# Patient Record
Sex: Male | Born: 1960 | Hispanic: No | Marital: Married | Smoking: Never smoker
Health system: Southern US, Community
[De-identification: ages and names within clinical notes are randomized; demographics above are authoritative.]

## PROBLEM LIST (undated history)

## (undated) DIAGNOSIS — I10 Essential (primary) hypertension: Secondary | ICD-10-CM

## (undated) DIAGNOSIS — E119 Type 2 diabetes mellitus without complications: Secondary | ICD-10-CM

---

## 2000-04-15 ENCOUNTER — Encounter: Admission: RE | Admit: 2000-04-15 | Discharge: 2000-04-15 | Payer: Self-pay | Admitting: Cardiovascular Disease

## 2000-04-15 ENCOUNTER — Encounter: Payer: Self-pay | Admitting: Cardiovascular Disease

## 2004-03-18 ENCOUNTER — Encounter: Admission: RE | Admit: 2004-03-18 | Discharge: 2004-03-18 | Payer: Self-pay | Admitting: Cardiovascular Disease

## 2004-06-01 ENCOUNTER — Emergency Department (HOSPITAL_COMMUNITY): Admission: EM | Admit: 2004-06-01 | Discharge: 2004-06-02 | Payer: Self-pay | Admitting: Emergency Medicine

## 2007-03-18 ENCOUNTER — Encounter: Admission: RE | Admit: 2007-03-18 | Discharge: 2007-03-18 | Payer: Self-pay | Admitting: Cardiovascular Disease

## 2011-11-17 ENCOUNTER — Telehealth: Payer: Self-pay | Admitting: *Deleted

## 2011-11-17 NOTE — Telephone Encounter (Signed)
Amy with [facility] called requesting ICD-9 code for labs.  Having Factor V Leiden mutation test for his son.  ICD-9 code for this is 289.81.  Code given to Amy.

## 2011-12-30 ENCOUNTER — Encounter: Payer: Self-pay | Admitting: Oncology

## 2015-02-23 ENCOUNTER — Emergency Department (HOSPITAL_COMMUNITY)
Admission: EM | Admit: 2015-02-23 | Discharge: 2015-02-23 | Disposition: A | Discharge status: Home / Self Care | Payer: Self-pay | Attending: Emergency Medicine | Admitting: Emergency Medicine

## 2015-02-23 ENCOUNTER — Encounter (HOSPITAL_COMMUNITY): Payer: Self-pay

## 2015-02-23 ENCOUNTER — Emergency Department (HOSPITAL_COMMUNITY): Payer: Self-pay

## 2015-02-23 DIAGNOSIS — M546 Pain in thoracic spine: Secondary | ICD-10-CM | POA: Insufficient documentation

## 2015-02-23 LAB — URINALYSIS, ROUTINE W REFLEX MICROSCOPIC
BILIRUBIN URINE: NEGATIVE
Glucose, UA: NEGATIVE mg/dL
Hgb urine dipstick: NEGATIVE
KETONES UR: NEGATIVE mg/dL
Leukocytes, UA: NEGATIVE
NITRITE: NEGATIVE
PROTEIN: NEGATIVE mg/dL
Specific Gravity, Urine: 1.022 (ref 1.005–1.030)
pH: 6.5 (ref 5.0–8.0)

## 2015-02-23 MED ORDER — CYCLOBENZAPRINE HCL 10 MG PO TABS: 10.0000 mg | ORAL_TABLET | Freq: Two times a day (BID) | ORAL | Status: DC | PRN

## 2015-02-23 MED ORDER — AZITHROMYCIN 250 MG PO TABS: 250.0000 mg | ORAL_TABLET | Freq: Every day | ORAL | Status: DC

## 2015-02-23 MED ORDER — IBUPROFEN 800 MG PO TABS
800.0000 mg | ORAL_TABLET | Freq: Three times a day (TID) | ORAL | Status: DC
Start: 2015-02-23 — End: 2015-02-24

## 2015-02-23 MED ORDER — OXYCODONE-ACETAMINOPHEN 5-325 MG PO TABS
1.0000 | ORAL_TABLET | Freq: Once | ORAL | Status: AC
  Administered 2015-02-23: 1 via ORAL
  Filled 2015-02-23: qty 1

## 2015-02-23 NOTE — ED Notes (Signed)
Pt states that the pain in his right flank started at 6pm on Friday while standing. Pt denies N/V. Pt states he has not been able to urinate as frequently as normal.

## 2015-02-23 NOTE — ED Provider Notes (Signed)
CSN: [id number]     Arrival date & time 02/23/15  0035 History   First MD Initiated Contact with Patient 02/23/15 0130     Chief Complaint  Patient presents with  . Flank Pain    right     (Consider location/radiation/quality/duration/timing/severity/associated sxs/prior Treatment) HPI Comments: Patient without significant medical history presents with right flank pain that started yesterday. No known injury. No midline back pain. The pain does not radiate. He denies abdominal pain or groin pain. No hematuria or dysuria. No history of stones and no nausea/vomiting. He states the pain is worse with twisting movement and with deep breathing. He states that the last time he had similar pain he had pneumonia. No cough or fever  Patient is a 54 y.o. male presenting with flank pain. The history is provided by the patient. No language interpreter was used.  Flank Pain Pertinent negatives include no abdominal pain, chest pain, chills, coughing, fever, nausea or vomiting.    History reviewed. No pertinent past medical history. History reviewed. No pertinent past surgical history. History reviewed. No pertinent family history. Social History  Substance Use Topics  . Smoking status: Never Smoker   . Smokeless tobacco: None  . Alcohol Use: No    Review of Systems  Constitutional: Negative for fever and chills.  Respiratory: Negative.  Negative for cough and shortness of breath.        Pain with respiration.  Cardiovascular: Negative.  Negative for chest pain.  Gastrointestinal: Negative.  Negative for nausea, vomiting and abdominal pain.  Genitourinary: Positive for flank pain. Negative for dysuria and hematuria.  Musculoskeletal: Negative.  Negative for back pain.  Neurological: Negative.       Allergies  Review of patient's allergies indicates no known allergies.  Home Medications   Prior to Admission medications   Not on File   BP 119/75 mmHg  Pulse 75  Temp(Src) 98.7 F  (37.1 C) (Oral)  Resp 18  SpO2 95% Physical Exam  Constitutional: He is oriented to person, place, and time. He appears well-developed and well-nourished.  HENT:  Head: Normocephalic.  Neck: Normal range of motion. Neck supple.  Cardiovascular: Normal rate and regular rhythm.   Pulmonary/Chest: Effort normal and breath sounds normal. He has no wheezes. He has no rales.  Splinting with deep breath.  Abdominal: Soft. Bowel sounds are normal. There is no tenderness. There is no rebound and no guarding.  Musculoskeletal: Normal range of motion.  Neurological: He is alert and oriented to person, place, and time.  Skin: Skin is warm and dry. No rash noted.  Psychiatric: He has a normal mood and affect.    ED Course  Procedures (including critical care time) Labs Review Labs Reviewed  URINALYSIS, ROUTINE W REFLEX MICROSCOPIC (NOT AT [facility])   Results for orders placed or performed during the hospital encounter of 02/23/15  Urinalysis, Routine w reflex microscopic-may I&O cath if menses (not at [facility])  Result Value Ref Range   Color, Urine YELLOW YELLOW   APPearance CLEAR CLEAR   Specific Gravity, Urine 1.022 1.005 - 1.030   pH 6.5 5.0 - 8.0   Glucose, UA NEGATIVE NEGATIVE mg/dL   Hgb urine dipstick NEGATIVE NEGATIVE   Bilirubin Urine NEGATIVE NEGATIVE   Ketones, ur NEGATIVE NEGATIVE mg/dL   Protein, ur NEGATIVE NEGATIVE mg/dL   Nitrite NEGATIVE NEGATIVE   Leukocytes, UA NEGATIVE NEGATIVE   Dg Chest 2 View  02/23/2015  CLINICAL DATA:  Right-sided flank and chest pain, onset last evening.  EXAM: CHEST  2 VIEW COMPARISON:  03/18/2007 FINDINGS: Low lung volumes. The cardiomediastinal contours are normal. Linear opacities in the left lower lobe with retrocardiac scarring. Pulmonary vasculature is normal. No consolidation, pleural effusion, or pneumothorax. No acute osseous abnormalities are seen. IMPRESSION: Linear left lower lobe opacities, likely atelectasis. Electronically Signed   By:  Rubye Oaks M.D.   On: 02/23/2015 02:13    Imaging Review No results found. I have personally reviewed and evaluated these images and lab results as part of my medical decision-making.   EKG Interpretation None      MDM   Final diagnoses:  None    1. Back pain  The patient's pain does not radiate, no urinary symptoms, negative UA, no history of kidney stones. Pain is worse with movement. He reports same pain with previous PNA. Negative CXR for PNA. Doubt kidney stones. Feel the patient's pain likely musculoskeletal requiring supportive care. As the patient feels he is getting ill and reports similar symptoms of flank pain with PNA but has no cough or fever at this point, will provide Rx for Z-Pack to fill if he develops a cough/fever. Discussed with the patient who understands and agrees.     Elpidio Anis, PA-C 02/23/15 0242  Layla Maw Ward, DO 02/23/15 [phone]

## 2015-02-23 NOTE — Discharge Instructions (Signed)
Back Pain, Adult °Back pain is very common in adults. The cause of back pain is rarely dangerous and the pain often gets better over time. The cause of your back pain may not be known. Some common causes of back pain include: °· Strain of the muscles or ligaments supporting the spine. °· Wear and tear (degeneration) of the spinal disks. °· Arthritis. °· Direct injury to the back. °For many people, back pain may return. Since back pain is rarely dangerous, most people can learn to manage this condition on their own. °HOME CARE INSTRUCTIONS °Watch your back pain for any changes. The following actions may help to lessen any discomfort you are feeling: °· Remain active. It is stressful on your back to sit or stand in one place for long periods of time. Do not sit, drive, or stand in one place for more than 30 minutes at a time. Take short walks on even surfaces as soon as you are able. Try to increase the length of time you walk each day. °· Exercise regularly as directed by your health care provider. Exercise helps your back heal faster. It also helps avoid future injury by keeping your muscles strong and flexible. °· Do not stay in bed. Resting more than 1-2 days can delay your recovery. °· Pay attention to your body when you bend and lift. The most comfortable positions are those that put less stress on your recovering back. Always use proper lifting techniques, including: °· Bending your knees. °· Keeping the load close to your body. °· Avoiding twisting. °· Find a comfortable position to sleep. Use a firm mattress and lie on your side with your knees slightly bent. If you lie on your back, put a pillow under your knees. °· Avoid feeling anxious or stressed. Stress increases muscle tension and can worsen back pain. It is important to recognize when you are anxious or stressed and learn ways to manage it, such as with exercise. °· Take medicines only as directed by your health care provider. Over-the-counter  medicines to reduce pain and inflammation are often the most helpful. Your health care provider may prescribe muscle relaxant drugs. These medicines help dull your pain so you can more quickly return to your normal activities and healthy exercise. °· Apply ice to the injured area: °· Put ice in a plastic bag. °· Place a towel between your skin and the bag. °· Leave the ice on for 20 minutes, 2-3 times a day for the first 2-3 days. After that, ice and heat may be alternated to reduce pain and spasms. °· Maintain a healthy weight. Excess weight puts extra stress on your back and makes it difficult to maintain good posture. °SEEK MEDICAL CARE IF: °· You have pain that is not relieved with rest or medicine. °· You have increasing pain going down into the legs or buttocks. °· You have pain that does not improve in one week. °· You have night pain. °· You lose weight. °· You have a fever or chills. °SEEK IMMEDIATE MEDICAL CARE IF:  °· You develop new bowel or bladder control problems. °· You have unusual weakness or numbness in your arms or legs. °· You develop nausea or vomiting. °· You develop abdominal pain. °· You feel faint. °  °This information is not intended to replace advice given to you by your health care provider. Make sure you discuss any questions you have with your health care provider. °  °Document Released: 03/02/2005 Document Revised: 03/23/2014 Document Reviewed: 07/04/2013 °Elsevier Interactive Patient Education ©2016 [employer] °Heat Therapy °Heat therapy can help ease sore, stiff, injured, and tight muscles and joints. Heat relaxes your muscles, which may help ease your pain.  °RISKS AND COMPLICATIONS °If you have any of the following conditions, do not use heat therapy unless your health care provider has approved: °· Poor circulation. °· Healing wounds or scarred skin in the area being treated. °· Diabetes, heart disease, or high blood pressure. °· Not being able to feel (numbness) the area  being treated. °· Unusual swelling of the area being treated. °· Active infections. °· Blood clots. °· Cancer. °· Inability to communicate pain. This may include young children and people who have problems with their brain function (dementia). °· Pregnancy. °Heat therapy should only be used on old, pre-existing, or long-lasting (chronic) injuries. Do not use heat therapy on new injuries unless directed by your health care provider. °HOW TO USE HEAT THERAPY °There are several different kinds of heat therapy, including: °· Moist heat pack. °· Warm water bath. °· Hot water bottle. °· Electric heating pad. °· Heated gel pack. °· Heated wrap. °· Electric heating pad. °Use the heat therapy method suggested by your health care provider. Follow your health care provider's instructions on when and how to use heat therapy. °GENERAL HEAT THERAPY RECOMMENDATIONS °· Do not sleep while using heat therapy. Only use heat therapy while you are awake. °· Your skin may turn pink while using heat therapy. Do not use heat therapy if your skin turns red. °· Do not use heat therapy if you have new pain. °· High heat or long exposure to heat can cause burns. Be careful when using heat therapy to avoid burning your skin. °· Do not use heat therapy on areas of your skin that are already irritated, such as with a rash or sunburn. °SEEK MEDICAL CARE IF: °· You have blisters, redness, swelling, or numbness. °· You have new pain. °· Your pain is worse. °MAKE SURE YOU: °· Understand these instructions. °· Will watch your condition. °· Will get help right away if you are not doing well or get worse. °  °This information is not intended to replace advice given to you by your health care provider. Make sure you discuss any questions you have with your health care provider. °  °Document Released: 05/25/2011 Document Revised: 03/23/2014 Document Reviewed: 04/25/2013 °Elsevier Interactive Patient Education ©2016 Elsevier Inc. ° °

## 2015-02-24 ENCOUNTER — Emergency Department (HOSPITAL_COMMUNITY)
Admission: EM | Admit: 2015-02-24 | Discharge: 2015-02-24 | Disposition: A | Discharge status: Home / Self Care | Payer: Self-pay | Attending: Emergency Medicine | Admitting: Emergency Medicine

## 2015-02-24 ENCOUNTER — Emergency Department (HOSPITAL_COMMUNITY): Payer: Self-pay

## 2015-02-24 ENCOUNTER — Encounter (HOSPITAL_COMMUNITY): Payer: Self-pay | Admitting: Emergency Medicine

## 2015-02-24 DIAGNOSIS — R109 Unspecified abdominal pain: Secondary | ICD-10-CM | POA: Insufficient documentation

## 2015-02-24 DIAGNOSIS — M545 Low back pain: Secondary | ICD-10-CM | POA: Insufficient documentation

## 2015-02-24 DIAGNOSIS — Z791 Long term (current) use of non-steroidal anti-inflammatories (NSAID): Secondary | ICD-10-CM | POA: Insufficient documentation

## 2015-02-24 MED ORDER — NAPROXEN 250 MG PO TABS: 250.0000 mg | ORAL_TABLET | Freq: Two times a day (BID) | ORAL | Status: DC

## 2015-02-24 MED ORDER — METHOCARBAMOL 500 MG PO TABS: 500.0000 mg | ORAL_TABLET | Freq: Two times a day (BID) | ORAL | Status: DC | PRN

## 2015-02-24 MED ORDER — OXYCODONE-ACETAMINOPHEN 5-325 MG PO TABS
1.0000 | ORAL_TABLET | Freq: Once | ORAL | Status: AC
  Administered 2015-02-24: 1 via ORAL
  Filled 2015-02-24: qty 1

## 2015-02-24 MED ORDER — METHOCARBAMOL 500 MG PO TABS
1000.0000 mg | ORAL_TABLET | Freq: Once | ORAL | Status: AC
  Administered 2015-02-24: 1000 mg via ORAL
  Filled 2015-02-24: qty 2

## 2015-02-24 MED ORDER — OXYCODONE-ACETAMINOPHEN 5-325 MG PO TABS: 1.0000 | ORAL_TABLET | ORAL | Status: DC | PRN

## 2015-02-24 NOTE — ED Provider Notes (Signed)
CSN: [id number]     Arrival date & time 02/24/15  1505 History  By signing my name below, I, Elon Spanner, attest that this documentation has been prepared under the direction and in the presence of [employer], PA-C. Electronically Signed: Elon Spanner ED Scribe. 02/24/2015. 3:44 PM.    Chief Complaint  Patient presents with  . Back Pain   The history is provided by the patient. No language interpreter was used.   HPI Comments: Joseph Velazquez is a 54 y.o. male who presents to the Emergency Department complaining of constant, >10/10, worsening, non-radiating right lower back pain onset two days ago without injury; worse with movement.  He reports he has had similar pain before that was dx'd as PNA, so he was evaluated for the same complaint in the ED two days ago with normal UA and CXR.  He was discharged home with motrin and muscle relaxer, both of which he has taken without relief.  He was also prescribed a Z-pack to be filled if he developed fever and chills.  He has not developed these symptoms but began the antibiotic regardless.  He denies hx of kidney stones.  He denies abdominal pains, hematuria, SOB, fever, cough, lightheadedness, LOC.    History reviewed. No pertinent past medical history. History reviewed. No pertinent past surgical history. No family history on file. Social History  Substance Use Topics  . Smoking status: Never Smoker   . Smokeless tobacco: None  . Alcohol Use: No    Review of Systems A complete 10 system review of systems was obtained and all systems are negative except as noted in the HPI and PMH.   Allergies  Review of patient's allergies indicates no known allergies.  Home Medications   Prior to Admission medications   Medication Sig Start Date End Date Taking? Authorizing Provider  azithromycin (ZITHROMAX) 250 MG tablet Take 1 tablet (250 mg total) by mouth daily. Take first 2 tablets together, then 1 every day until finished. 02/23/15   Elpidio Anis, PA-C  cyclobenzaprine (FLEXERIL) 10 MG tablet Take 1 tablet (10 mg total) by mouth 2 (two) times daily as needed for muscle spasms. 02/23/15   Elpidio Anis, PA-C  ibuprofen (ADVIL,MOTRIN) 800 MG tablet Take 1 tablet (800 mg total) by mouth 3 (three) times daily. 02/23/15   Elpidio Anis, PA-C  naproxen sodium (ANAPROX) 220 MG tablet Take 220 mg by mouth 2 (two) times daily as needed (pain).    Historical Provider, MD   BP 124/85 mmHg  Pulse 90  Temp(Src) 97.8 F (36.6 C) (Oral)  Resp 16  SpO2 96% Physical Exam  Constitutional: He is oriented to person, place, and time. He appears well-developed and well-nourished. No distress.  HENT:  Head: Normocephalic and atraumatic.  Eyes: Conjunctivae and EOM are normal.  Neck: Normal range of motion. Neck supple. No tracheal deviation present.  Cardiovascular: Normal rate, regular rhythm and intact distal pulses.   Pulmonary/Chest: Effort normal and breath sounds normal. No respiratory distress.  Abdominal: Soft. Bowel sounds are normal. He exhibits no distension and no mass. There is no tenderness. There is no rebound and no guarding.  Genitourinary:  No CVA tenderness to percussion bilaterally  Musculoskeletal: Normal range of motion. He exhibits tenderness.       Back:  Neurological: He is alert and oriented to person, place, and time.  No point tenderness to percussion of lumbar spinal processes.  No TTP or paraspinal muscular spasm. Strength is 5 out of 5  to bilateral lower extremities at hip and knee; extensor hallucis longus 5 out of 5. Ankle strength 5 out of 5, no clonus, neurovascularly intact. No saddle anaesthesia. Patellar reflexes are 2+ bilaterally.    Ambulates with a coordinated gait. Straight leg raise is negative bilaterally.    Skin: Skin is warm and dry.  Psychiatric: He has a normal mood and affect. His behavior is normal.  Nursing note and vitals reviewed.   ED Course  Procedures (including critical care  time)  DIAGNOSTIC STUDIES: Oxygen Saturation is 96% on RA, normal by my interpretation.    COORDINATION OF CARE:    Labs Review Labs Reviewed - No data to display  Imaging Review Dg Chest 2 View  02/23/2015  CLINICAL DATA:  Right-sided flank and chest pain, onset last evening. EXAM: CHEST  2 VIEW COMPARISON:  03/18/2007 FINDINGS: Low lung volumes. The cardiomediastinal contours are normal. Linear opacities in the left lower lobe with retrocardiac scarring. Pulmonary vasculature is normal. No consolidation, pleural effusion, or pneumothorax. No acute osseous abnormalities are seen. IMPRESSION: Linear left lower lobe opacities, likely atelectasis. Electronically Signed   By: Rubye OaksMelanie  Ehinger M.D.   On: 02/23/2015 02:13   I have personally reviewed and evaluated these images and lab results as part of my medical decision-making.   EKG Interpretation None      MDM   Final diagnoses:  None    Filed Vitals:   02/24/15 1510  BP: 124/85  Pulse: 90  Temp: 97.8 F (36.6 C)  TempSrc: Oral  Resp: 16  SpO2: 96%    Medications  oxyCODONE-acetaminophen (PERCOCET/ROXICET) 5-325 MG per tablet 1 tablet (1 tablet Oral Given 02/24/15 1554)  methocarbamol (ROBAXIN) tablet 1,000 mg (1,000 mg Oral Given 02/24/15 1554)    Joseph Velazquez is 54 y.o. male presenting with right flank pain. Neuro exam is nonfocal, no signs that this is a intra-abdominal process however, given patient's persistent pain will CT to evaluate both renal stone and abdominal aorta. Case signed out to PA Dansie at shift change.   I personally performed the services described in this documentation, which was scribed in my presence. The recorded information has been reviewed and is accurate.    Wynetta Emeryicole Nykole Matos, PA-C 02/24/15 1623  Cathren LaineKevin Steinl, MD 02/27/15 1240

## 2015-02-24 NOTE — Discharge Instructions (Signed)
Back Exercises °The following exercises strengthen the muscles that help to support the back. They also help to keep the lower back flexible. Doing these exercises can help to prevent back pain or lessen existing pain. °If you have back pain or discomfort, try doing these exercises 2-3 times each day or as told by your health care provider. When the pain goes away, do them once each day, but increase the number of times that you repeat the steps for each exercise (do more repetitions). If you do not have back pain or discomfort, do these exercises once each day or as told by your health care provider. °EXERCISES °Single Knee to Chest °Repeat these steps 3-5 times for each leg: °· Lie on your back on a firm bed or the floor with your legs extended. °· Bring one knee to your chest. Your other leg should stay extended and in contact with the floor. °· Hold your knee in place by grabbing your knee or thigh. °· Pull on your knee until you feel a gentle stretch in your lower back. °· Hold the stretch for 10-30 seconds. °· Slowly release and straighten your leg. °Pelvic Tilt °Repeat these steps 5-10 times: °· Lie on your back on a firm bed or the floor with your legs extended. °· Bend your knees so they are pointing toward the ceiling and your feet are flat on the floor. °· Tighten your lower abdominal muscles to press your lower back against the floor. This motion will tilt your pelvis so your tailbone points up toward the ceiling instead of pointing to your feet or the floor. °· With gentle tension and even breathing, hold this position for 5-10 seconds. °Cat-Cow °Repeat these steps until your lower back becomes more flexible: °· Get into a hands-and-knees position on a firm surface. Keep your hands under your shoulders, and keep your knees under your hips. You may place padding under your knees for comfort. °· Let your head hang down, and point your tailbone toward the floor so your lower back becomes rounded like the  back of a cat. °· Hold this position for 5 seconds. °· Slowly lift your head and point your tailbone up toward the ceiling so your back forms a sagging arch like the back of a cow. °· Hold this position for 5 seconds. °Press-Ups °Repeat these steps 5-10 times: °· Lie on your abdomen (face-down) on the floor. °· Place your palms near your head, about shoulder-width apart. °· While you keep your back as relaxed as possible and keep your hips on the floor, slowly straighten your arms to raise the top half of your body and lift your shoulders. Do not use your back muscles to raise your upper torso. You may adjust the placement of your hands to make yourself more comfortable. °· Hold this position for 5 seconds while you keep your back relaxed. °· Slowly return to lying flat on the floor. °Bridges °Repeat these steps 10 times: °1. Lie on your back on a firm surface. °2. Bend your knees so they are pointing toward the ceiling and your feet are flat on the floor. °3. Tighten your buttocks muscles and lift your buttocks off of the floor until your waist is at almost the same height as your knees. You should feel the muscles working in your buttocks and the back of your thighs. If you do not feel these muscles, slide your feet 1-2 inches farther away from your buttocks. °4. Hold this position for 3-5   seconds. °5. Slowly lower your hips to the starting position, and allow your buttocks muscles to relax completely. °If this exercise is too easy, try doing it with your arms crossed over your chest. °Abdominal Crunches °Repeat these steps 5-10 times: °1. Lie on your back on a firm bed or the floor with your legs extended. °2. Bend your knees so they are pointing toward the ceiling and your feet are flat on the floor. °3. Cross your arms over your chest. °4. Tip your chin slightly toward your chest without bending your neck. °5. Tighten your abdominal muscles and slowly raise your trunk (torso) high enough to lift your shoulder  blades a tiny bit off of the floor. Avoid raising your torso higher than that, because it can put too much stress on your low back and it does not help to strengthen your abdominal muscles. °6. Slowly return to your starting position. °Back Lifts °Repeat these steps 5-10 times: °1. Lie on your abdomen (face-down) with your arms at your sides, and rest your forehead on the floor. °2. Tighten the muscles in your legs and your buttocks. °3. Slowly lift your chest off of the floor while you keep your hips pressed to the floor. Keep the back of your head in line with the curve in your back. Your eyes should be looking at the floor. °4. Hold this position for 3-5 seconds. °5. Slowly return to your starting position. °SEEK MEDICAL CARE IF: °· Your back pain or discomfort gets much worse when you do an exercise. °· Your back pain or discomfort does not lessen within 2 hours after you exercise. °If you have any of these problems, stop doing these exercises right away. Do not do them again unless your health care provider says that you can. °SEEK IMMEDIATE MEDICAL CARE IF: °· You develop sudden, severe back pain. If this happens, stop doing the exercises right away. Do not do them again unless your health care provider says that you can. °  °This information is not intended to replace advice given to you by your health care provider. Make sure you discuss any questions you have with your health care provider. °  °Document Released: 04/09/2004 Document Revised: 11/21/2014 Document Reviewed: 04/26/2014 °Elsevier Interactive Patient Education ©2016 [employer] ° °Back Pain, Adult °Back pain is very common in adults. The cause of back pain is rarely dangerous and the pain often gets better over time. The cause of your back pain may not be known. Some common causes of back pain include: °· Strain of the muscles or ligaments supporting the spine. °· Wear and tear (degeneration) of the spinal disks. °· Arthritis. °· Direct injury  to the back. °For many people, back pain may return. Since back pain is rarely dangerous, most people can learn to manage this condition on their own. °HOME CARE INSTRUCTIONS °Watch your back pain for any changes. The following actions may help to lessen any discomfort you are feeling: °· Remain active. It is stressful on your back to sit or stand in one place for long periods of time. Do not sit, drive, or stand in one place for more than 30 minutes at a time. Take short walks on even surfaces as soon as you are able. Try to increase the length of time you walk each day. °· Exercise regularly as directed by your health care provider. Exercise helps your back heal faster. It also helps avoid future injury by keeping your muscles strong and flexible. °· Do not stay in   bed. Resting more than 1-2 days can delay your recovery. °· Pay attention to your body when you bend and lift. The most comfortable positions are those that put less stress on your recovering back. Always use proper lifting techniques, including: °¨ Bending your knees. °¨ Keeping the load close to your body. °¨ Avoiding twisting. °· Find a comfortable position to sleep. Use a firm mattress and lie on your side with your knees slightly bent. If you lie on your back, put a pillow under your knees. °· Avoid feeling anxious or stressed. Stress increases muscle tension and can worsen back pain. It is important to recognize when you are anxious or stressed and learn ways to manage it, such as with exercise. °· Take medicines only as directed by your health care provider. Over-the-counter medicines to reduce pain and inflammation are often the most helpful. Your health care provider may prescribe muscle relaxant drugs. These medicines help dull your pain so you can more quickly return to your normal activities and healthy exercise. °· Apply ice to the injured area: °¨ Put ice in a plastic bag. °¨ Place a towel between your skin and the bag. °¨ Leave the ice on  for 20 minutes, 2-3 times a day for the first 2-3 days. After that, ice and heat may be alternated to reduce pain and spasms. °· Maintain a healthy weight. Excess weight puts extra stress on your back and makes it difficult to maintain good posture. °SEEK MEDICAL CARE IF: °· You have pain that is not relieved with rest or medicine. °· You have increasing pain going down into the legs or buttocks. °· You have pain that does not improve in one week. °· You have night pain. °· You lose weight. °· You have a fever or chills. °SEEK IMMEDIATE MEDICAL CARE IF:  °· You develop new bowel or bladder control problems. °· You have unusual weakness or numbness in your arms or legs. °· You develop nausea or vomiting. °· You develop abdominal pain. °· You feel faint. °  °This information is not intended to replace advice given to you by your health care provider. Make sure you discuss any questions you have with your health care provider. °  °Document Released: 03/02/2005 Document Revised: 03/23/2014 Document Reviewed: 07/04/2013 °Elsevier Interactive Patient Education ©2016 [employer] ° °

## 2015-02-24 NOTE — ED Notes (Addendum)
Pt c/o right sided low back pain onset Friday, denies recent injury. Pt seen here for same yesterday, reports prescribed pain medication not working.

## 2015-02-24 NOTE — ED Provider Notes (Signed)
This patient presented to the emergency department with right flank pain for the past 2 days. Patient was seen and evaluated by Wynetta EmeryNicole Pisciotta, PA-C and care transferred at shift change. Please see her note for further. At time of shift change the patient is awaiting CT renal stone study to ensure there is no renal stone causing his pain. Plan is if scan is normal to discharge with naproxen, Percocet for breakthrough pain and Robaxin.  CT renal stone study showed fatty infiltration of the liver and is otherwise unremarkable. At my evaluation the patient reports he's feeling much better after pain medicines received in the emergency department. He reports he is now able to move around better. He is able to ambulate in the room without difficulty. We'll discharge with prescriptions for naproxen, Percocet for breakthrough pain and Robaxin. I also educated on back exercises and provided him with information at discharge. I encouraged him to follow-up with his primary care provider this week. I advised the patient to follow-up with their primary care provider this week. I advised the patient to return to the emergency department with new or worsening symptoms or new concerns. The patient verbalized understanding and agreement with plan.    Results for orders placed or performed during the hospital encounter of 02/23/15  Urinalysis, Routine w reflex microscopic-may I&O cath if menses (not [facility])  Result Value Ref Range   Color, Urine YELLOW YELLOW   APPearance CLEAR CLEAR   Specific Gravity, Urine 1.022 1.005 - 1.030   pH 6.5 5.0 - 8.0   Glucose, UA NEGATIVE NEGATIVE mg/dL   Hgb urine dipstick NEGATIVE NEGATIVE   Bilirubin Urine NEGATIVE NEGATIVE   Ketones, ur NEGATIVE NEGATIVE mg/dL   Protein, ur NEGATIVE NEGATIVE mg/dL   Nitrite NEGATIVE NEGATIVE   Leukocytes, UA NEGATIVE NEGATIVE   Dg Chest 2 View  02/23/2015  CLINICAL DATA:  Right-sided flank and chest pain, onset last evening. EXAM: CHEST  2  VIEW COMPARISON:  03/18/2007 FINDINGS: Low lung volumes. The cardiomediastinal contours are normal. Linear opacities in the left lower lobe with retrocardiac scarring. Pulmonary vasculature is normal. No consolidation, pleural effusion, or pneumothorax. No acute osseous abnormalities are seen. IMPRESSION: Linear left lower lobe opacities, likely atelectasis. Electronically Signed   By: Rubye OaksMelanie  Ehinger M.D.   On: 02/23/2015 02:13   Ct Renal Stone Study  02/24/2015  CLINICAL DATA:  Acute right flank pain. EXAM: CT ABDOMEN AND PELVIS WITHOUT CONTRAST TECHNIQUE: Multidetector CT imaging of the abdomen and pelvis was performed following the standard protocol without IV contrast. COMPARISON:  None. FINDINGS: Visualized lung bases are unremarkable. No significant osseous abnormality is noted. No gallstones are noted. Fatty infiltration of the liver is noted with sparing around the gallbladder fossa. The spleen and pancreas appear normal. Adrenal glands and kidneys appear normal. No hydronephrosis or renal obstruction is noted. No renal or ureteral calculi are noted. The appendix appears normal. There is no evidence of bowel obstruction. No abnormal fluid collection is noted. Urinary bladder appears normal. No significant adenopathy is noted. IMPRESSION: Fatty infiltration of the liver. No hydronephrosis or renal obstruction is noted. No renal or ureteral calculi are noted. Electronically Signed   By: Lupita RaiderJames  Green Jr, M.D.   On: 02/24/2015 16:42      Everlene FarrierWilliam Luvern Mcisaac, PA-C 02/24/15 1750  Cathren LaineKevin Steinl, MD 02/27/15 1239

## 2016-05-27 ENCOUNTER — Encounter (HOSPITAL_BASED_OUTPATIENT_CLINIC_OR_DEPARTMENT_OTHER): Payer: Self-pay

## 2016-05-27 ENCOUNTER — Emergency Department (HOSPITAL_BASED_OUTPATIENT_CLINIC_OR_DEPARTMENT_OTHER)
Admission: EM | Admit: 2016-05-27 | Discharge: 2016-05-28 | Disposition: A | Discharge status: Home / Self Care | Payer: BLUE CROSS/BLUE SHIELD | Attending: Emergency Medicine | Admitting: Emergency Medicine

## 2016-05-27 ENCOUNTER — Emergency Department (HOSPITAL_BASED_OUTPATIENT_CLINIC_OR_DEPARTMENT_OTHER): Payer: BLUE CROSS/BLUE SHIELD

## 2016-05-27 DIAGNOSIS — J029 Acute pharyngitis, unspecified: Secondary | ICD-10-CM | POA: Diagnosis not present

## 2016-05-27 DIAGNOSIS — J209 Acute bronchitis, unspecified: Secondary | ICD-10-CM

## 2016-05-27 DIAGNOSIS — R05 Cough: Secondary | ICD-10-CM | POA: Diagnosis present

## 2016-05-27 MED ORDER — ALBUTEROL SULFATE HFA 108 (90 BASE) MCG/ACT IN AERS
2.0000 | INHALATION_SPRAY | RESPIRATORY_TRACT | Status: DC | PRN
  Administered 2016-05-27: 2 via RESPIRATORY_TRACT
  Filled 2016-05-27: qty 6.7

## 2016-05-27 MED ORDER — ACETAMINOPHEN 325 MG PO TABS
650.0000 mg | ORAL_TABLET | Freq: Once | ORAL | Status: AC
  Administered 2016-05-27: 650 mg via ORAL
  Filled 2016-05-27: qty 2

## 2016-05-27 NOTE — ED Triage Notes (Signed)
C/o cough x 2 days-NAD-steady gait 

## 2016-05-27 NOTE — ED Provider Notes (Signed)
[facility] Provider Note: Lowella Dell, MD, FACEP  CSN: [id number] MRN: [id number] ARRIVAL: 05/27/16 at 2240 ROOM: MHOTF/OTF  By signing my name below, I, Alyssa Grove, attest that this documentation has been prepared under the direction and in the presence of Paula Libra, MD. Electronically Signed: Alyssa Grove, ED Scribe. 05/27/16. 11:43 PM.   CHIEF COMPLAINT  Cough   HISTORY OF PRESENT ILLNESS  Joseph Velazquez is a 56 y.o. male who presents to the Emergency Department complaining of gradual onset, initially dry cough beginning yesterday. His cough has gradually become productive of yellow sputum. He reports associated shortness of breath. He has tried 1 dose of both 500 mg Zithromax and OTC Delsym with no significant relief. Pt denies body aches, nausea, vomiting, diarrhea and fever, though he was noted to have low-grade fever on arrival.  History reviewed. No pertinent past medical history.  History reviewed. No pertinent surgical history.  No family history on file.  Social History  Substance Use Topics  . Smoking status: Never Smoker  . Smokeless tobacco: Never Used  . Alcohol use No    Prior to Admission medications   Not on File    Allergies Patient has no known allergies.   REVIEW OF SYSTEMS  Negative except as noted here or in the History of Present Illness.   PHYSICAL EXAMINATION  Initial Vital Signs Blood pressure 143/78, pulse 93, temperature 100.2 F (37.9 C), temperature source Oral, resp. rate 21, height 5\' 10"  (1.778 m), weight 227 lb (103 kg), SpO2 97 %.  Examination General: Well-developed, well-nourished male in no acute distress; appearance consistent with age of record HENT: normocephalic; atraumatic Eyes: pupils equal, round and reactive to light; extraocular muscles intact Neck: supple Heart: regular rate and rhythm Lungs: diminished on the left with faint expiratory wheeze in the left base Abdomen: soft; nondistended;  nontender; no masses or hepatosplenomegaly; bowel sounds present Extremities: No deformity; full range of motion; pulses normal Neurologic: Awake, alert and oriented; motor function intact in all extremities and symmetric; no facial droop Skin: Warm and dry Psychiatric: Normal mood and affect   RESULTS  Summary of this visit's results, reviewed by myself:   EKG Interpretation  Date/Time:    Ventricular Rate:    PR Interval:    QRS Duration:   QT Interval:    QTC Calculation:   R Axis:     Text Interpretation:        Laboratory Studies: No results found for this or any previous visit (from the past 24 hour(s)). Imaging Studies: Dg Chest 2 View  Result Date: 05/27/2016 CLINICAL DATA:  Initial evaluation for acute cough for 2 days. EXAM: CHEST  2 VIEW COMPARISON:  Prior radiograph from 02/23/2015. FINDINGS: Mild cardiomegaly, stable. Mediastinal silhouette within normal limits. Lungs are normally inflated. No pulmonary edema. No focal infiltrates identified. No pneumothorax. No acute osseous abnormality. Blunting of the bilateral costophrenic angles may reflect trace pleural effusions. IMPRESSION: 1. Minimal blunting of the bilateral costophrenic angles, suggesting trace pleural effusions. 2. No other active cardiopulmonary disease. No focal infiltrates or edema. 3. Stable mild cardiomegaly. Electronically Signed   By: Rise Mu M.D.   On: 05/27/2016 23:08    ED COURSE  Nursing notes and initial vitals signs, including pulse oximetry, reviewed.  Vitals:   05/27/16 2246 05/27/16 2358  BP: 143/78 124/78  Pulse: 93 90  Resp: 21 18  Temp: 100.2 F (37.9 C) 98.2 F (36.8 C)  TempSrc: Oral Oral  SpO2:  97% 97%  Weight: 227 lb (103 kg)   Height: 5\' 10"  (1.778 m)    5:21 AM We will provide the patient with an inhaler and instructed him that she used. He was advised to continue the 3 day course of Zithromax as this is an appropriate antibiotic for acute bronchitis. The  [place] from [place]  PROCEDURES    ED DIAGNOSES     ICD-9-CM ICD-10-CM   1. Acute bronchitis with bronchospasm 466.0 J20.9    I personally performed the services described in this documentation, which was scribed in my presence. The recorded information has been reviewed and is accurate.    Paula LibraJohn Socorro Ebron, MD 05/28/16 [id number]

## 2017-01-27 ENCOUNTER — Other Ambulatory Visit: Payer: Self-pay

## 2017-01-27 ENCOUNTER — Emergency Department (HOSPITAL_COMMUNITY): Payer: BLUE CROSS/BLUE SHIELD

## 2017-01-27 ENCOUNTER — Emergency Department (HOSPITAL_COMMUNITY)
Admission: EM | Admit: 2017-01-27 | Discharge: 2017-01-27 | Disposition: A | Discharge status: Home / Self Care | Payer: BLUE CROSS/BLUE SHIELD | Attending: Emergency Medicine | Admitting: Emergency Medicine

## 2017-01-27 ENCOUNTER — Encounter (HOSPITAL_COMMUNITY): Payer: Self-pay | Admitting: Emergency Medicine

## 2017-01-27 DIAGNOSIS — Y998 Other external cause status: Secondary | ICD-10-CM | POA: Diagnosis not present

## 2017-01-27 DIAGNOSIS — Y929 Unspecified place or not applicable: Secondary | ICD-10-CM | POA: Diagnosis not present

## 2017-01-27 DIAGNOSIS — W270XXA Contact with workbench tool, initial encounter: Secondary | ICD-10-CM | POA: Diagnosis not present

## 2017-01-27 DIAGNOSIS — S6710XA Crushing injury of unspecified finger(s), initial encounter: Secondary | ICD-10-CM

## 2017-01-27 DIAGNOSIS — I10 Essential (primary) hypertension: Secondary | ICD-10-CM | POA: Diagnosis not present

## 2017-01-27 DIAGNOSIS — S67191A Crushing injury of left index finger, initial encounter: Secondary | ICD-10-CM | POA: Insufficient documentation

## 2017-01-27 DIAGNOSIS — E119 Type 2 diabetes mellitus without complications: Secondary | ICD-10-CM | POA: Diagnosis not present

## 2017-01-27 DIAGNOSIS — Y939 Activity, unspecified: Secondary | ICD-10-CM | POA: Insufficient documentation

## 2017-01-27 HISTORY — DX: Type 2 diabetes mellitus without complications: E11.9

## 2017-01-27 HISTORY — DX: Essential (primary) hypertension: I10

## 2017-01-27 MED ORDER — LIDOCAINE HCL 2 % IJ SOLN
10.0000 mL | Freq: Once | INTRAMUSCULAR | Status: AC
  Administered 2017-01-27: 200 mg
  Filled 2017-01-27: qty 20

## 2017-01-27 MED ORDER — BUPIVACAINE HCL 0.5 % IJ SOLN
50.0000 mL | Freq: Once | INTRAMUSCULAR | Status: DC
Start: 2017-01-27 — End: 2017-01-27
  Filled 2017-01-27: qty 50

## 2017-01-27 MED ORDER — CEPHALEXIN 500 MG PO CAPS: 500.0000 mg | ORAL_CAPSULE | Freq: Four times a day (QID) | ORAL | 0 refills | Status: AC

## 2017-01-27 MED ORDER — HYDROCODONE-ACETAMINOPHEN 5-325 MG PO TABS: 2.0000 | ORAL_TABLET | ORAL | 0 refills | Status: AC | PRN

## 2017-01-27 NOTE — ED Provider Notes (Signed)
[facility]   CSN: [id number] Arrival date & time: 01/27/17  1331     History   Chief Complaint Chief Complaint  Patient presents with  . Finger Injury    HPI Joseph Velazquez is a 56 y.o. male.  The history is provided by the patient. No language interpreter was used.  Hand Pain  This is a new problem. The current episode started more than 2 days ago. The problem occurs constantly. The problem has been gradually worsening. Nothing aggravates the symptoms. Nothing relieves the symptoms. He has tried nothing for the symptoms. The treatment provided no relief.  Pt hit finger with a hammer.  Pt complains of pain and nail injury  Past Medical History:  Diagnosis Date  . Diabetes mellitus without complication (HCC)   . Hypertension     There are no active problems to display for this patient.   History reviewed. No pertinent surgical history.     Home Medications    Prior to Admission medications   Not on File    Family History History reviewed. No pertinent family history.  Social History Social History   Tobacco Use  . Smoking status: Never Smoker  . Smokeless tobacco: Never Used  Substance Use Topics  . Alcohol use: No  . Drug use: No     Allergies   Patient has no known allergies.   Review of Systems Review of Systems  All other systems reviewed and are negative.    Physical Exam Updated Vital Signs BP 93/60   Pulse 93   Temp (!) 97.4 F (36.3 C)   Resp 18   SpO2 99%   Physical Exam  Constitutional: He appears well-developed and well-nourished.  HENT:  Head: Normocephalic and atraumatic.  Eyes: Conjunctivae are normal.  Neck: Neck supple.  Cardiovascular: Normal rate.  No murmur heard. Pulmonary/Chest: Effort normal. No respiratory distress.  Abdominal: Soft. There is no tenderness.  Musculoskeletal: He exhibits no edema.  Deformed distal tip left index finger,  Nail avulsed,     Neurological: He is alert.  Skin: Skin is warm and dry.  Psychiatric: He has a normal mood and affect.  Nursing note and vitals reviewed.    ED Treatments / Results  Labs (all labs ordered are listed, but only abnormal results are displayed) Labs Reviewed - No data to display  EKG  EKG Interpretation None       Radiology Dg Hand Complete Left  Result Date: 01/27/2017 CLINICAL DATA:  Smashed finger with hammer EXAM: LEFT HAND - COMPLETE 3+ VIEW COMPARISON:  None. FINDINGS: Small lucency at the tuft of the second distal phalanx. No subluxation. Overlying soft tissue injury with radiopaque material overlying the distal digit soft tissues. IMPRESSION: Possible nondisplaced tuft fracture at the second distal phalanx Electronically Signed   By: Jasmine PangKim  Fujinaga M.D.   On: 01/27/2017 15:56    Procedures Procedures (including critical care time)  Medications Ordered in ED Medications  bupivacaine (MARCAINE) 0.5 % (with pres) injection 50 mL (not administered)  lidocaine (XYLOCAINE) 2 % (with pres) injection 200 mg (not administered)     Initial Impression / Assessment and Plan / ED Course  I have reviewed the triage vital signs and the nursing notes.  Pertinent labs & imaging results that were available during my care of the patient were reviewed by me and considered in my medical decision making (see chart for details).    I spoke to Dr. Melvyn Novasortmann due  to fracture and amount of tissue damage with bone exposed.  He advised to have pt present at his office at 10:00 am.  Wound cleaned and irrigatted by EMT.  Dressing and splint applied.  Follow up discussed with pt who understands.   Final Clinical Impressions(s) / ED Diagnoses   Final diagnoses:  Crushing injury of finger, initial encounter    ED Discharge Orders    None    An After Visit Summary was printed and given to the patient.    Osie CheeksSofia, Leslie K, PA-C 01/27/17 2038    Doug SouJacubowitz, Sam, MD 01/28/17 1504

## 2017-01-27 NOTE — ED Triage Notes (Signed)
Pt here from home with c/o left finger injury ,. Pt smashed his pointer finger with a hammer

## 2017-05-17 ENCOUNTER — Other Ambulatory Visit: Payer: Self-pay | Admitting: Cardiovascular Disease

## 2017-05-17 ENCOUNTER — Ambulatory Visit
Admission: RE | Admit: 2017-05-17 | Discharge: 2017-05-17 | Disposition: A | Discharge status: Home / Self Care | Payer: BLUE CROSS/BLUE SHIELD | Source: Ambulatory Visit | Attending: Cardiovascular Disease | Admitting: Cardiovascular Disease

## 2017-05-17 DIAGNOSIS — R059 Cough, unspecified: Secondary | ICD-10-CM

## 2017-05-17 DIAGNOSIS — R062 Wheezing: Secondary | ICD-10-CM

## 2017-05-17 DIAGNOSIS — R05 Cough: Secondary | ICD-10-CM

## 2018-05-18 ENCOUNTER — Other Ambulatory Visit: Payer: Self-pay | Admitting: Cardiovascular Disease

## 2018-05-18 ENCOUNTER — Ambulatory Visit
Admission: RE | Admit: 2018-05-18 | Discharge: 2018-05-18 | Disposition: A | Discharge status: Home / Self Care | Payer: BLUE CROSS/BLUE SHIELD | Source: Ambulatory Visit | Attending: Cardiovascular Disease | Admitting: Cardiovascular Disease

## 2018-05-18 DIAGNOSIS — M25571 Pain in right ankle and joints of right foot: Secondary | ICD-10-CM

## 2018-06-09 IMAGING — DX DG HAND COMPLETE 3+V*L*
3 series · 3 of 3 positions shown · non-contrast
Comparison: None.

CLINICAL DATA: Smashed finger with hammer

EXAM:
LEFT HAND - COMPLETE 3+ VIEW

[hand pa]
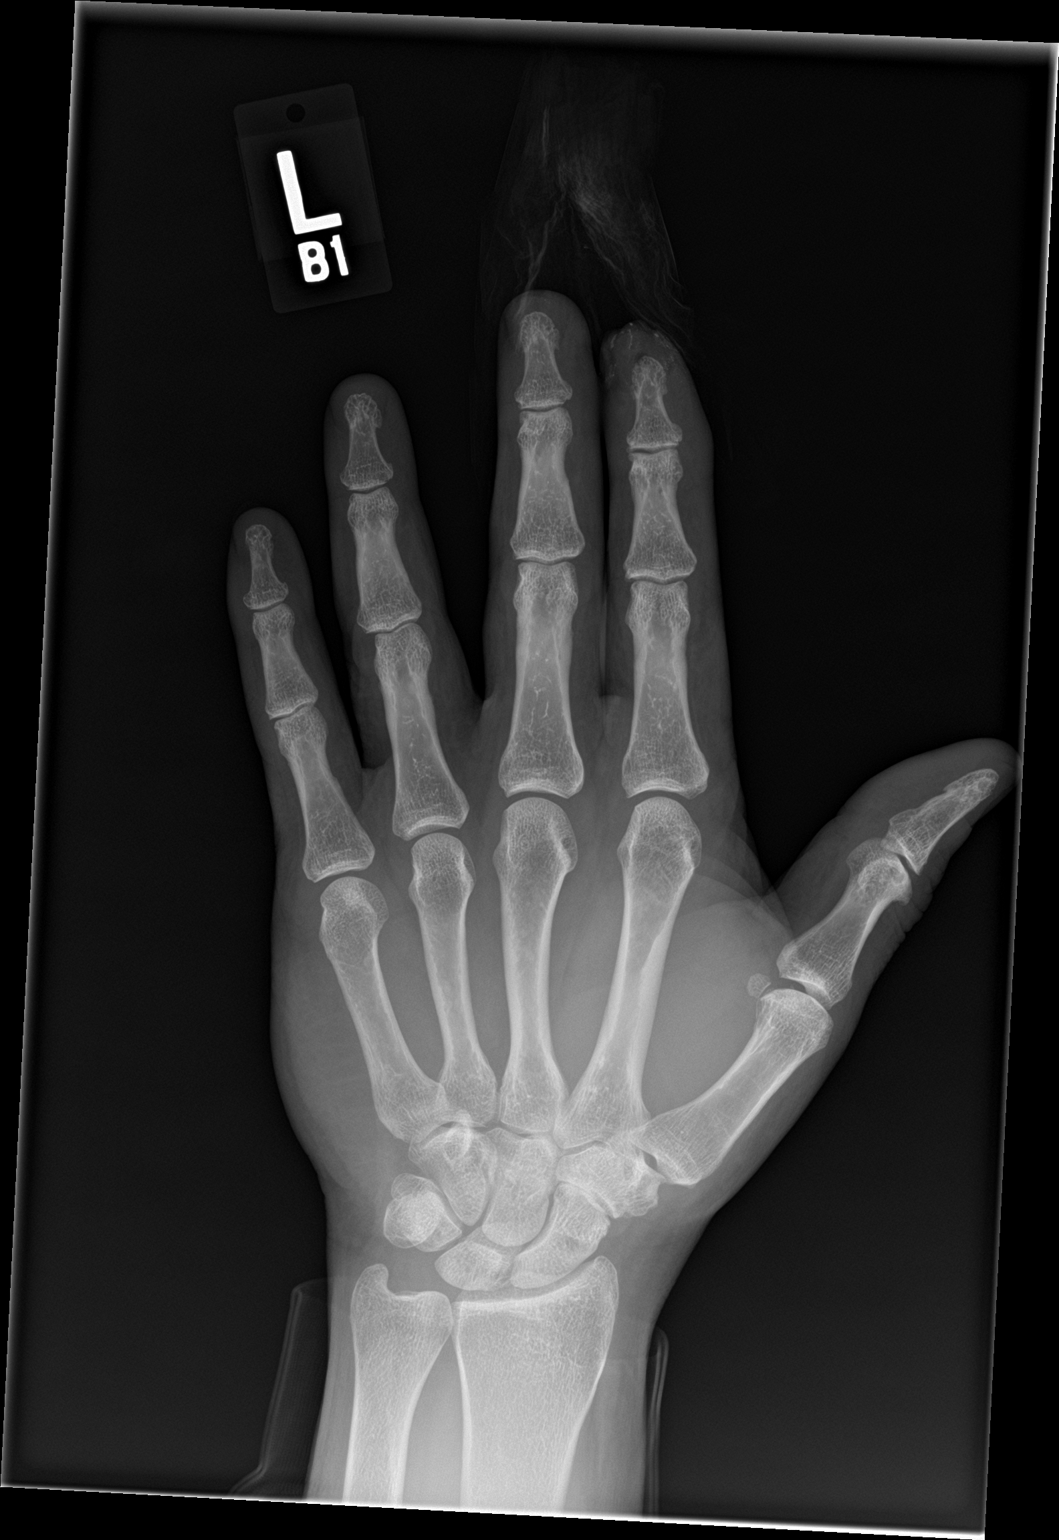

[hand obl]
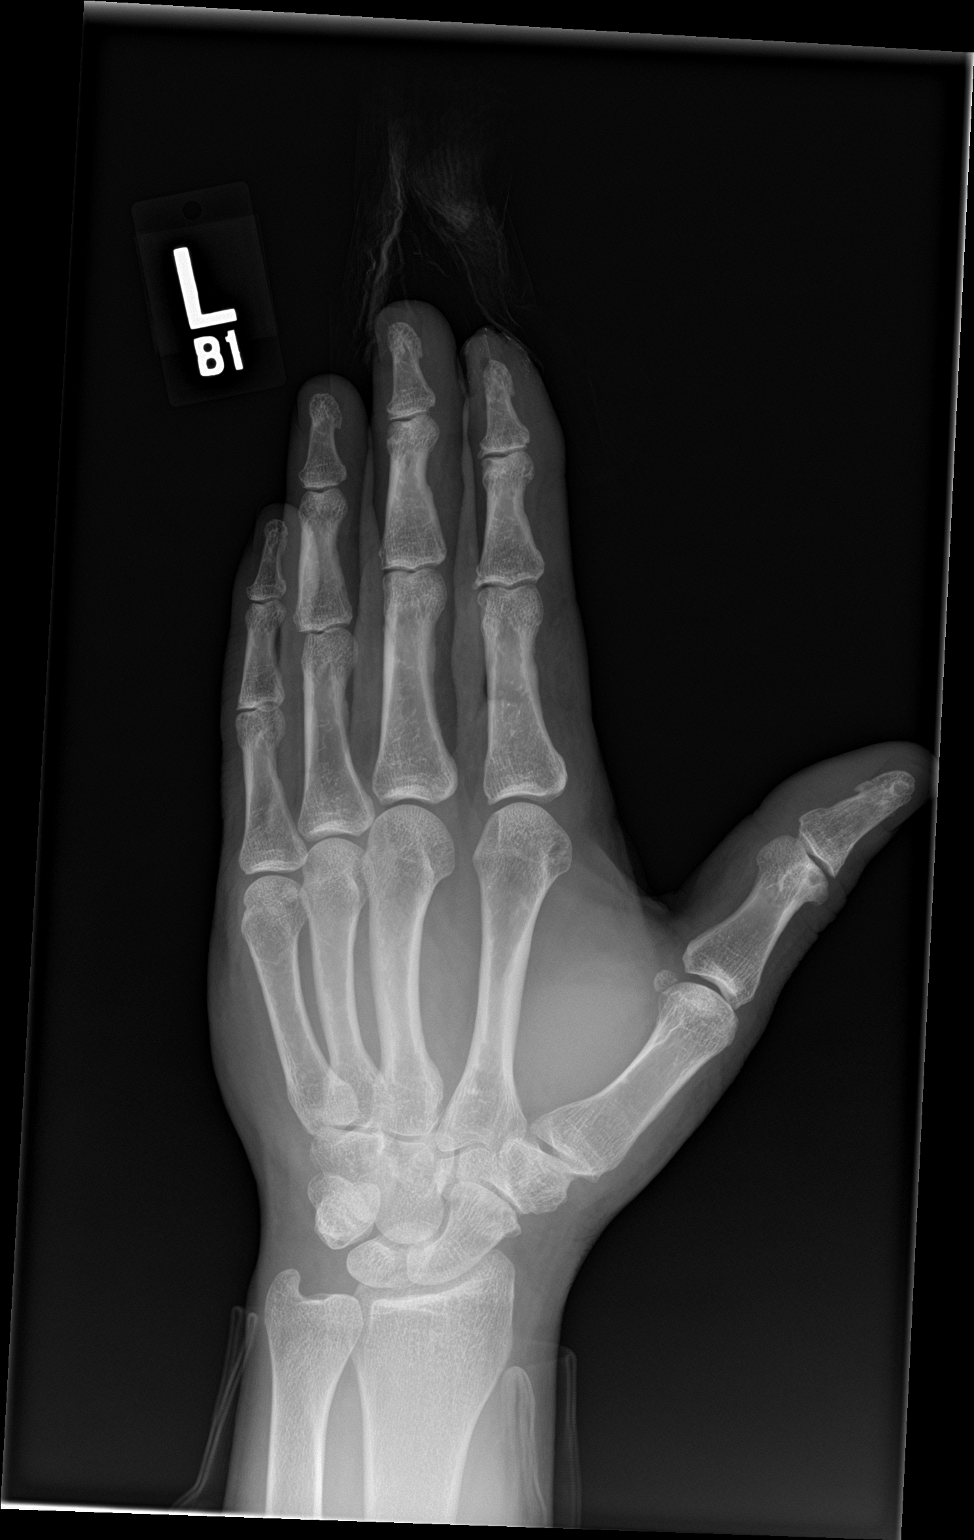

[hand lat]
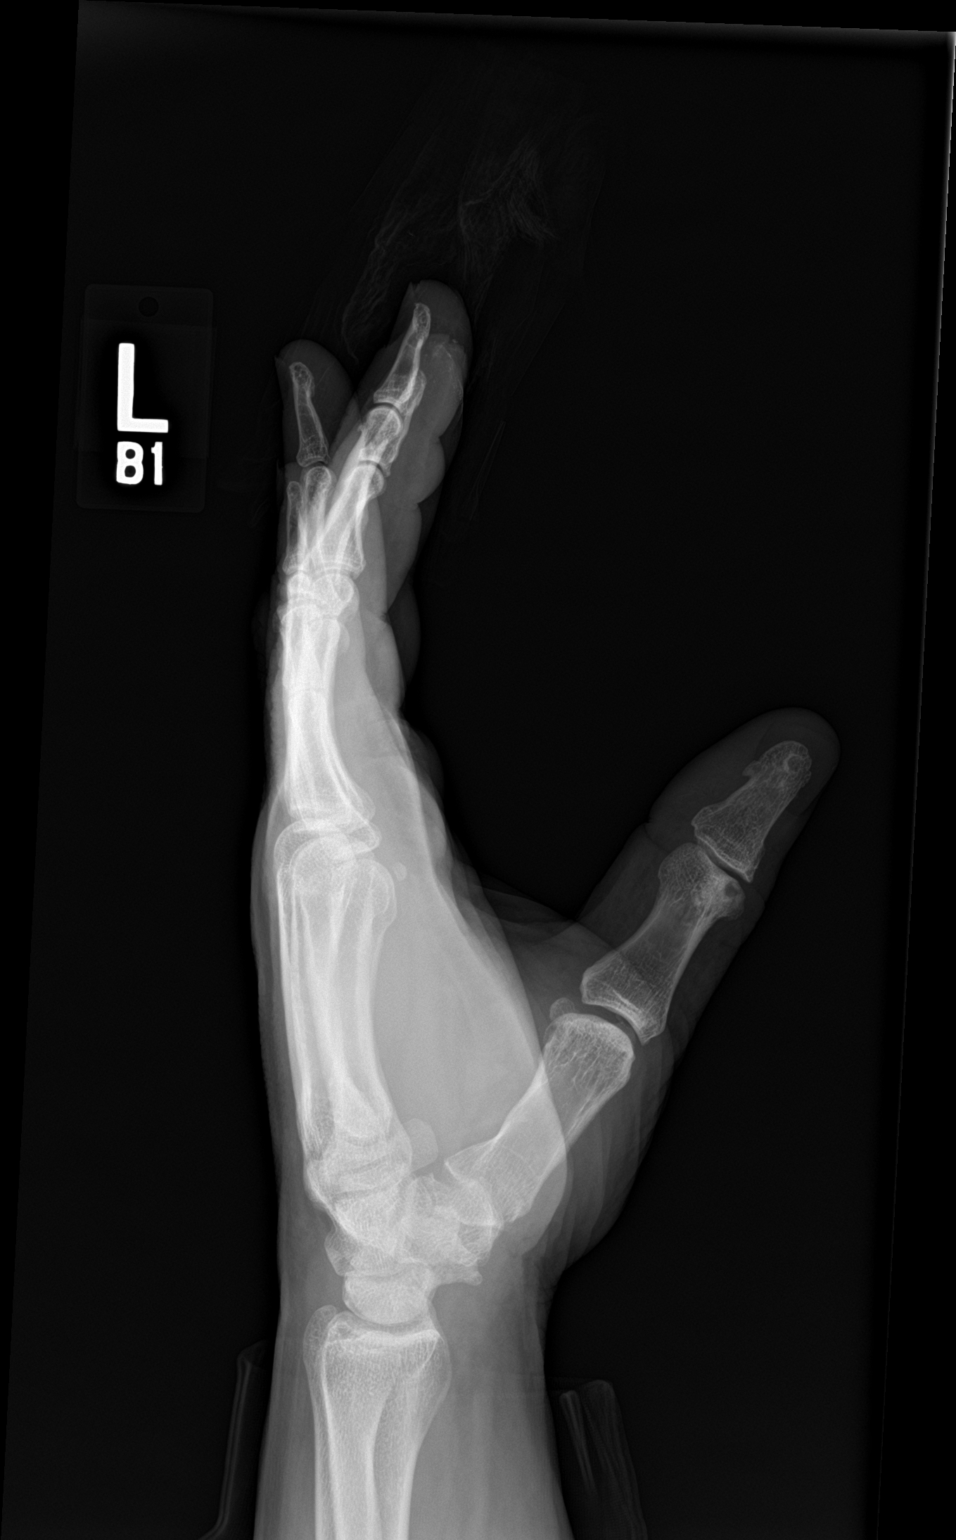

[3 of 3 positions shown; findings below may reference images not displayed]

FINDINGS: Small lucency at the tuft of the second distal phalanx. No
subluxation. Overlying soft tissue injury with radiopaque material
overlying the distal digit soft tissues.
IMPRESSION: Possible nondisplaced tuft fracture at the second distal phalanx

## 2018-09-27 IMAGING — DX DG CHEST 2V
2 series · 2 of 2 positions shown · non-contrast
Comparison: 05/27/2016

CLINICAL DATA: Cough and wheezing for 1 month

EXAM:
CHEST  2 VIEW

[dg chest 2 view (1 of 2)]
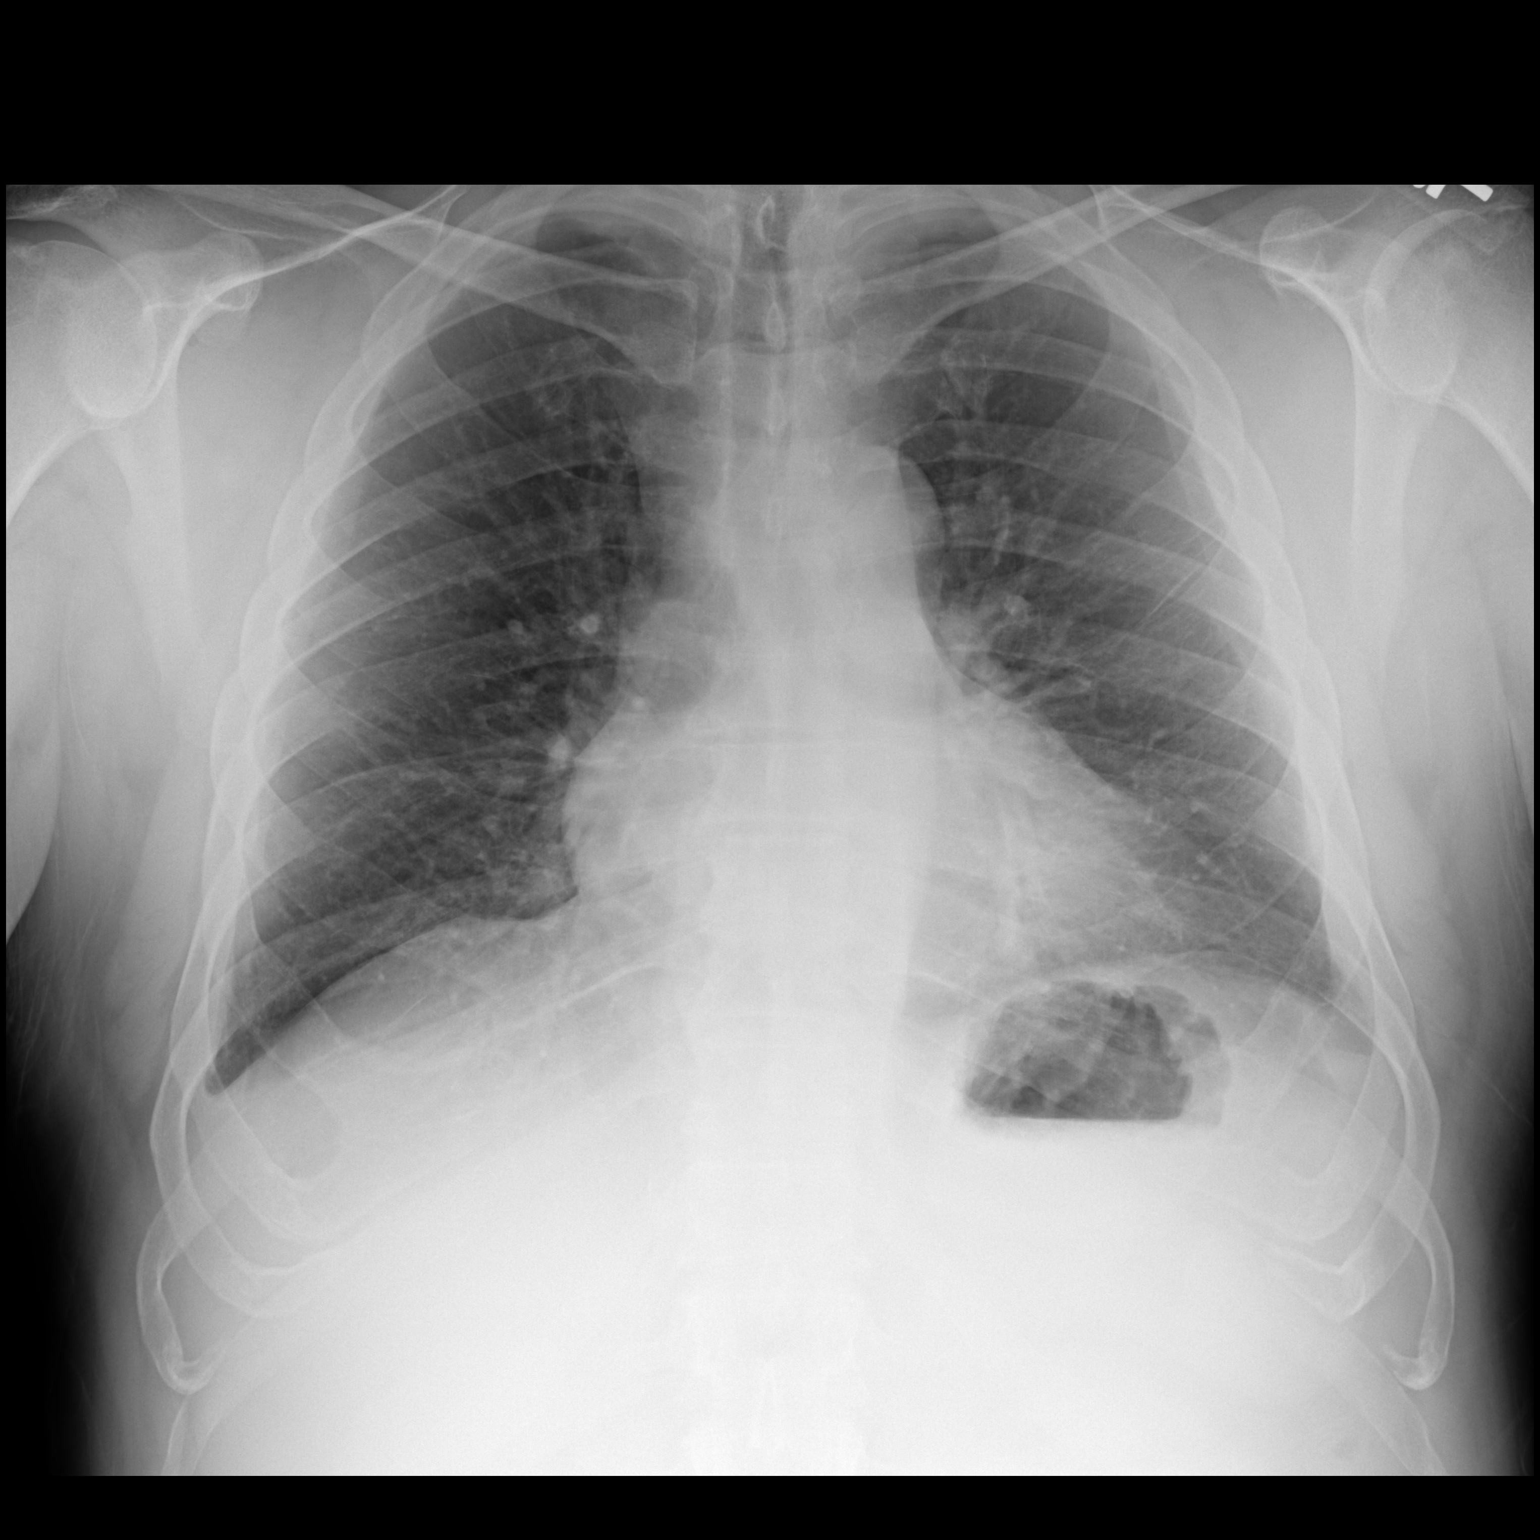

[dg chest 2 view (2 of 2)]
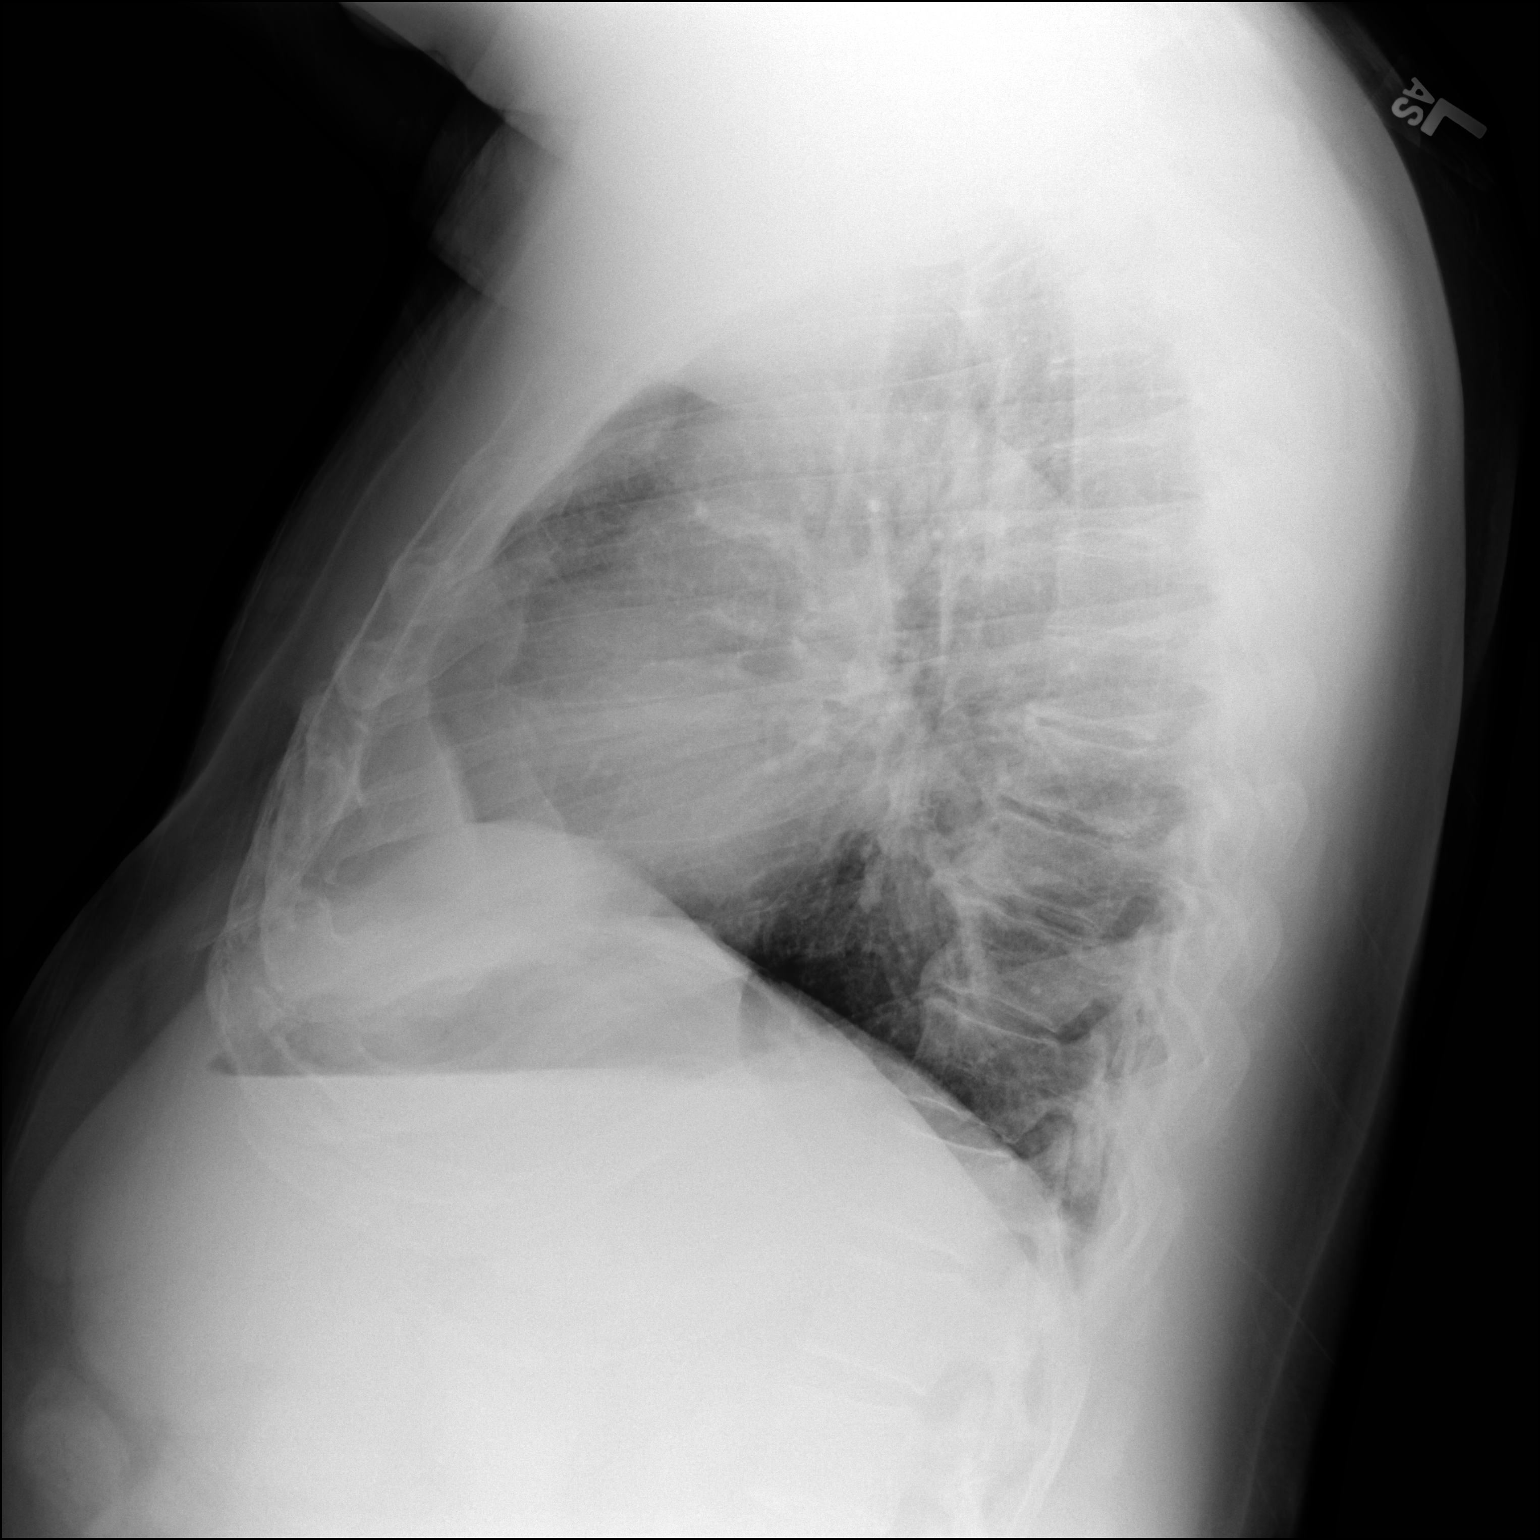

[2 of 2 positions shown; findings below may reference images not displayed]

FINDINGS: Cardiac shadow is stable. The lungs are well aerated bilaterally. No
focal infiltrate or sizable effusion is seen. Chronic blunting of
the costophrenic angles is noted. No acute bony abnormality is
noted.
IMPRESSION: No acute abnormality noted.
